# Patient Record
Sex: Female | Born: 1985 | Marital: Single
Health system: Southern US, Community
[De-identification: ages and names within clinical notes are randomized; demographics above are authoritative.]

---

## 2018-07-04 ENCOUNTER — Ambulatory Visit: Payer: 59 | Admitting: Licensed Clinical Social Worker

## 2018-07-04 ENCOUNTER — Ambulatory Visit (INDEPENDENT_AMBULATORY_CARE_PROVIDER_SITE_OTHER): Payer: 59 | Admitting: Licensed Clinical Social Worker

## 2018-07-04 DIAGNOSIS — F324 Major depressive disorder, single episode, in partial remission: Secondary | ICD-10-CM

## 2018-07-19 ENCOUNTER — Ambulatory Visit (INDEPENDENT_AMBULATORY_CARE_PROVIDER_SITE_OTHER): Payer: 59 | Admitting: Licensed Clinical Social Worker

## 2018-07-19 DIAGNOSIS — F324 Major depressive disorder, single episode, in partial remission: Secondary | ICD-10-CM | POA: Diagnosis not present

## 2018-07-25 ENCOUNTER — Ambulatory Visit: Payer: 59 | Admitting: Licensed Clinical Social Worker

## 2018-07-26 ENCOUNTER — Ambulatory Visit (INDEPENDENT_AMBULATORY_CARE_PROVIDER_SITE_OTHER): Payer: 59 | Admitting: Licensed Clinical Social Worker

## 2018-07-26 DIAGNOSIS — F324 Major depressive disorder, single episode, in partial remission: Secondary | ICD-10-CM

## 2018-08-02 ENCOUNTER — Ambulatory Visit (INDEPENDENT_AMBULATORY_CARE_PROVIDER_SITE_OTHER): Payer: 59 | Admitting: Licensed Clinical Social Worker

## 2018-08-02 DIAGNOSIS — F324 Major depressive disorder, single episode, in partial remission: Secondary | ICD-10-CM | POA: Diagnosis not present

## 2018-08-10 ENCOUNTER — Ambulatory Visit (INDEPENDENT_AMBULATORY_CARE_PROVIDER_SITE_OTHER): Payer: 59 | Admitting: Licensed Clinical Social Worker

## 2018-08-10 DIAGNOSIS — F324 Major depressive disorder, single episode, in partial remission: Secondary | ICD-10-CM | POA: Diagnosis not present

## 2018-08-23 ENCOUNTER — Ambulatory Visit (INDEPENDENT_AMBULATORY_CARE_PROVIDER_SITE_OTHER): Payer: 59 | Admitting: Licensed Clinical Social Worker

## 2018-08-23 DIAGNOSIS — F324 Major depressive disorder, single episode, in partial remission: Secondary | ICD-10-CM

## 2018-08-31 ENCOUNTER — Ambulatory Visit: Payer: 59 | Admitting: Licensed Clinical Social Worker

## 2018-09-21 ENCOUNTER — Ambulatory Visit: Payer: 59 | Admitting: Licensed Clinical Social Worker

## 2018-10-03 ENCOUNTER — Ambulatory Visit: Payer: 59 | Admitting: Licensed Clinical Social Worker

## 2018-10-19 ENCOUNTER — Ambulatory Visit (INDEPENDENT_AMBULATORY_CARE_PROVIDER_SITE_OTHER): Payer: 59 | Admitting: Licensed Clinical Social Worker

## 2018-10-19 DIAGNOSIS — F324 Major depressive disorder, single episode, in partial remission: Secondary | ICD-10-CM

## 2018-10-25 ENCOUNTER — Ambulatory Visit (INDEPENDENT_AMBULATORY_CARE_PROVIDER_SITE_OTHER): Payer: 59 | Admitting: Licensed Clinical Social Worker

## 2018-10-25 DIAGNOSIS — F331 Major depressive disorder, recurrent, moderate: Secondary | ICD-10-CM | POA: Diagnosis not present

## 2018-11-15 ENCOUNTER — Ambulatory Visit: Payer: 59 | Admitting: Licensed Clinical Social Worker

## 2018-12-14 ENCOUNTER — Ambulatory Visit (INDEPENDENT_AMBULATORY_CARE_PROVIDER_SITE_OTHER): Payer: BC Managed Care – PPO | Admitting: Licensed Clinical Social Worker

## 2018-12-14 DIAGNOSIS — F324 Major depressive disorder, single episode, in partial remission: Secondary | ICD-10-CM

## 2019-01-17 ENCOUNTER — Ambulatory Visit (INDEPENDENT_AMBULATORY_CARE_PROVIDER_SITE_OTHER): Payer: BC Managed Care – PPO | Admitting: Licensed Clinical Social Worker

## 2019-01-17 DIAGNOSIS — F324 Major depressive disorder, single episode, in partial remission: Secondary | ICD-10-CM

## 2019-07-26 ENCOUNTER — Other Ambulatory Visit: Payer: Self-pay | Admitting: Obstetrics and Gynecology

## 2019-07-26 DIAGNOSIS — M7989 Other specified soft tissue disorders: Secondary | ICD-10-CM

## 2019-08-09 ENCOUNTER — Other Ambulatory Visit: Payer: Self-pay

## 2019-08-16 ENCOUNTER — Ambulatory Visit
Admission: RE | Admit: 2019-08-16 | Discharge: 2019-08-16 | Disposition: A | Discharge status: Home / Self Care | Payer: BC Managed Care – PPO | Source: Ambulatory Visit | Attending: Obstetrics and Gynecology | Admitting: Obstetrics and Gynecology

## 2019-08-16 ENCOUNTER — Other Ambulatory Visit: Payer: Self-pay

## 2019-08-16 ENCOUNTER — Other Ambulatory Visit: Payer: Self-pay | Admitting: Obstetrics and Gynecology

## 2019-08-16 DIAGNOSIS — M7989 Other specified soft tissue disorders: Secondary | ICD-10-CM

## 2020-11-02 IMAGING — US US AXILLARY RIGHT
1 series · 5 of 5 positions shown · non-contrast
Comparison: None.

CLINICAL DATA: Fullness in both axilla. Palpable lump in the left
axillary tail.

EXAM:
DIGITAL DIAGNOSTIC BILATERAL MAMMOGRAM WITH CAD AND TOMO
ULTRASOUND BILATERAL BREAST

[Series 1: us axillary right · 0.06mm/px · 5 of 5 slices shown]
[im 1/5]
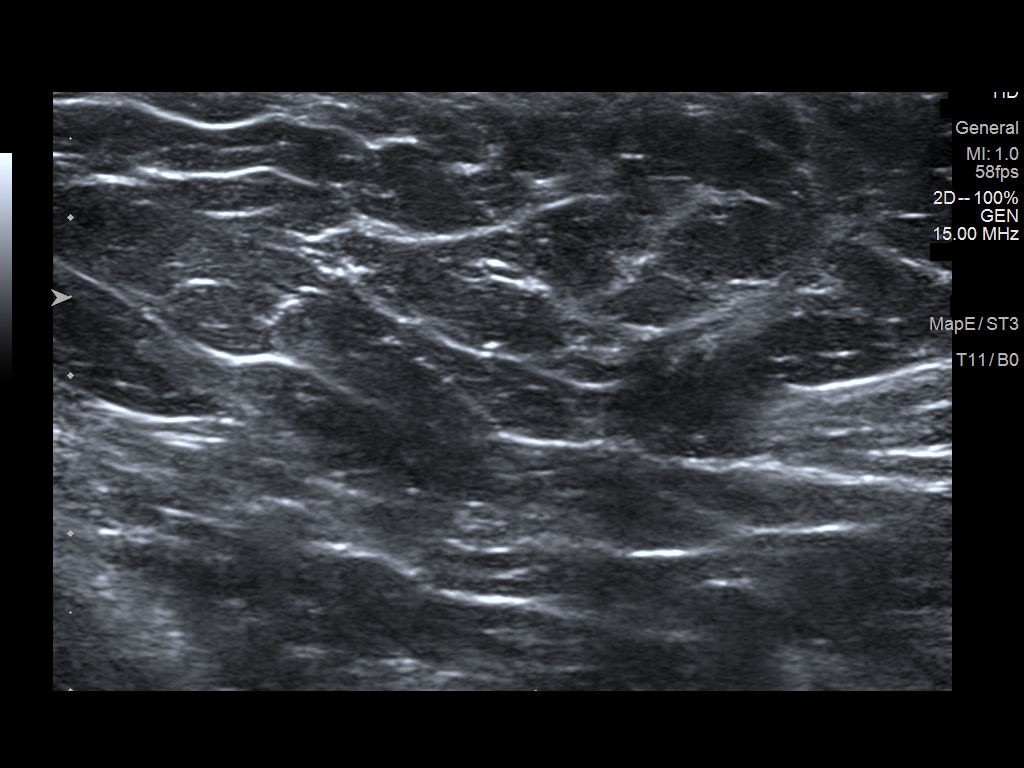
[im 2/5]
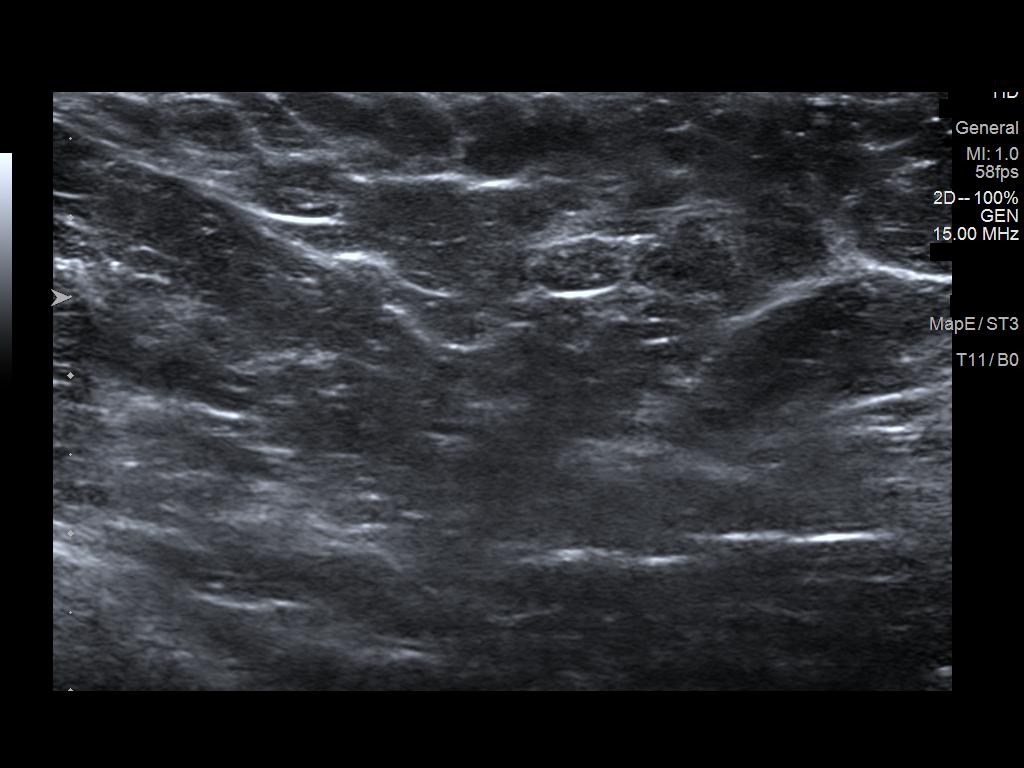
[im 3/5]
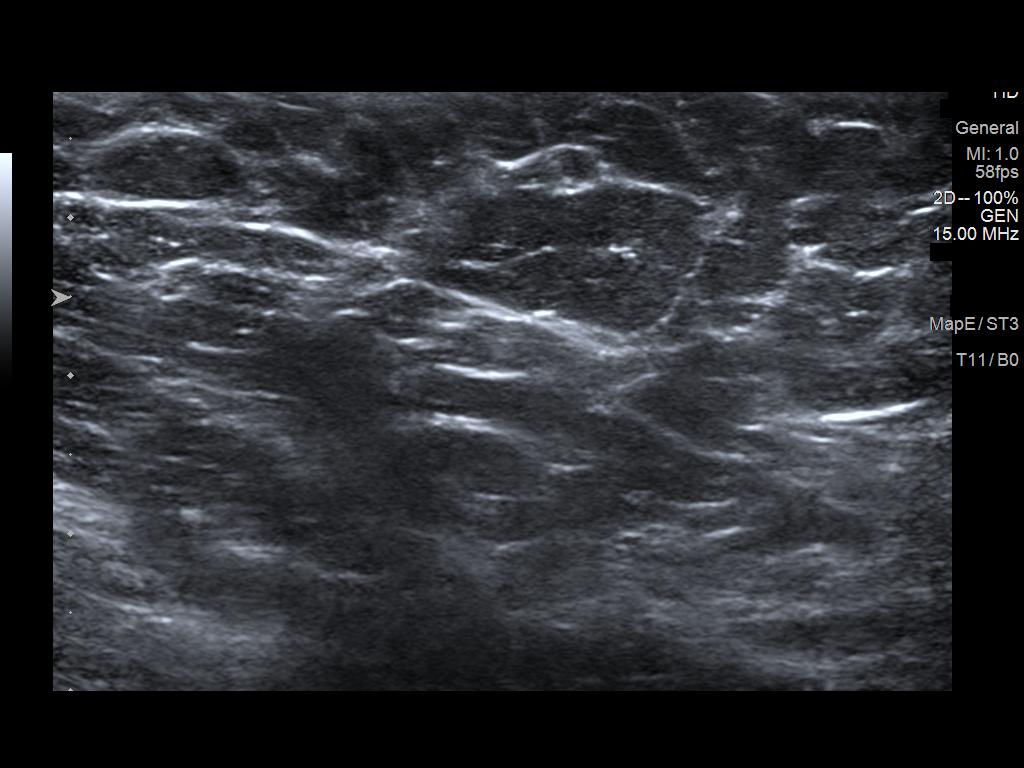
[im 4/5]
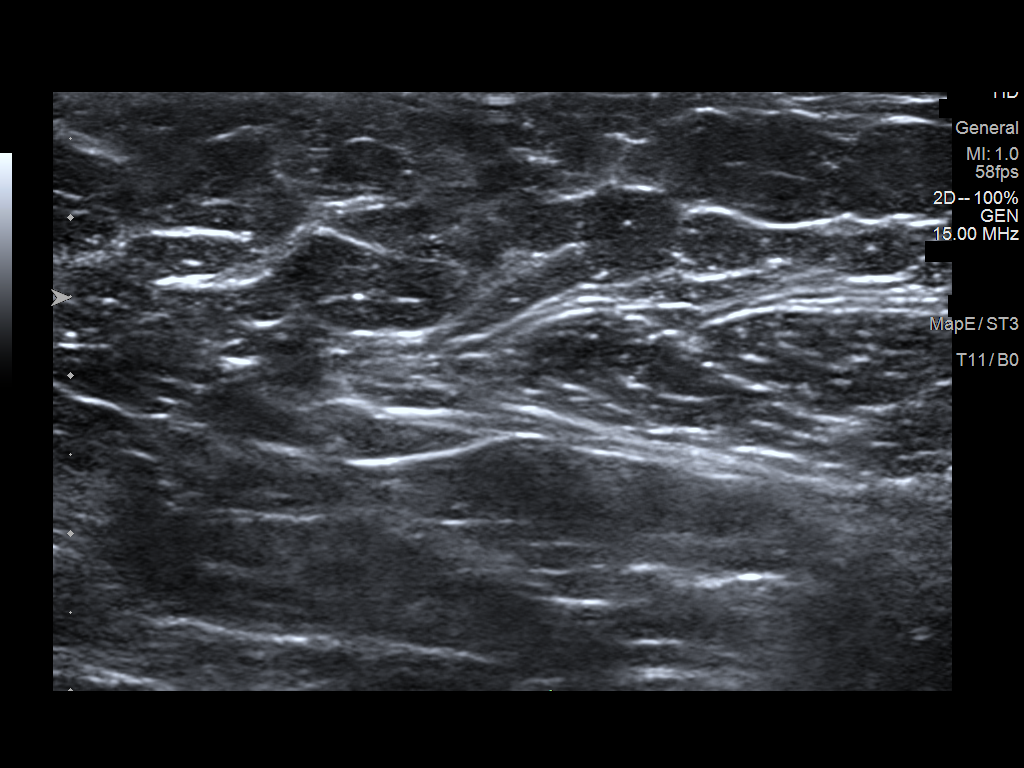
[im 5/5]
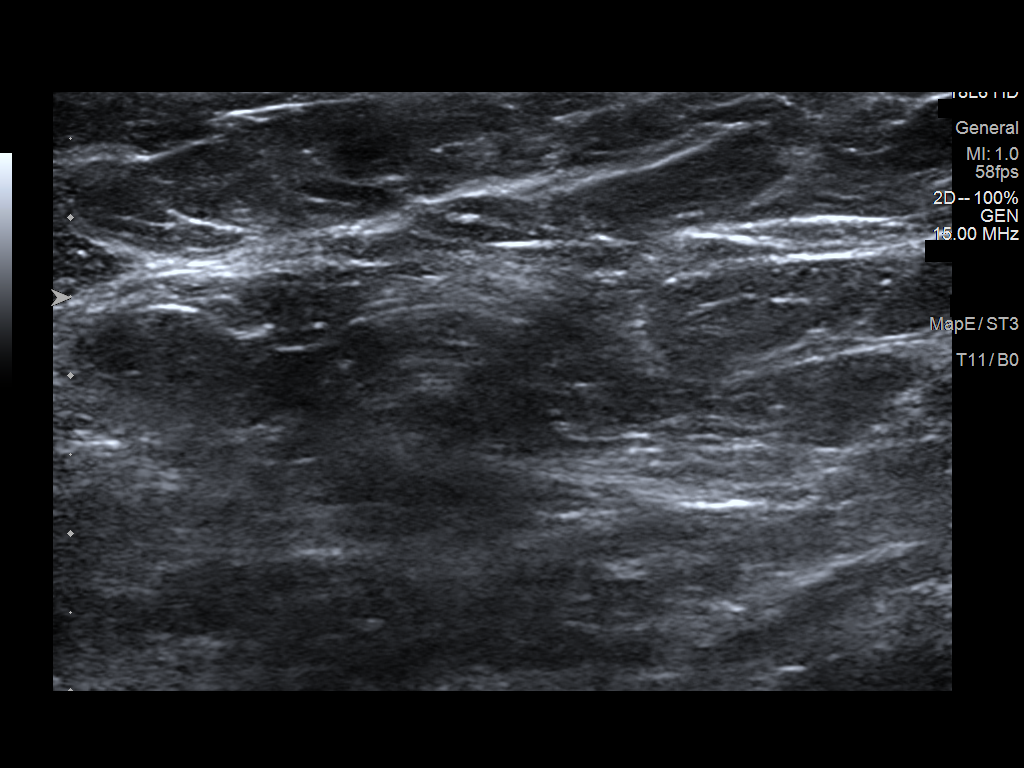

[5 of 5 positions shown; findings below may reference images not displayed]

ACR Breast Density Category b: There are scattered areas of
fibroglandular density.
FINDINGS: No suspicious masses, calcifications, or distortion are seen in the
right breast. There is a mass in the upper outer left breast,
probably an intramammary lymph node. No other suspicious findings on
the.

Mammographic images were processed with CAD.

On physical exam, no suspicious lumps are identified.

Targeted ultrasound is performed, showing normal tissue in the
axilla. An intramammary lymph node at 2 o'clock correlates with the
mammographically identified mass. No other suspicious findings.
IMPRESSION: No mammographic or sonographic evidence of malignancy.

RECOMMENDATION:
Annual screening mammography beginning at the age of 40.

I have discussed the findings and recommendations with the patient.
If applicable, a reminder letter will be sent to the patient
regarding the next appointment.

BI-RADS CATEGORY  2: Benign.

## 2020-11-02 IMAGING — US US BREAST*L* LIMITED INC AXILLA
1 series · 14 of 14 positions shown · non-contrast
Comparison: None.

CLINICAL DATA: Fullness in both axilla. Palpable lump in the left
axillary tail.

EXAM:
DIGITAL DIAGNOSTIC BILATERAL MAMMOGRAM WITH CAD AND TOMO
ULTRASOUND BILATERAL BREAST

[Series 1: us breast*left* limited inc axilla · 0.06mm/px · 14 of 14 slices shown]
[im 1/14]
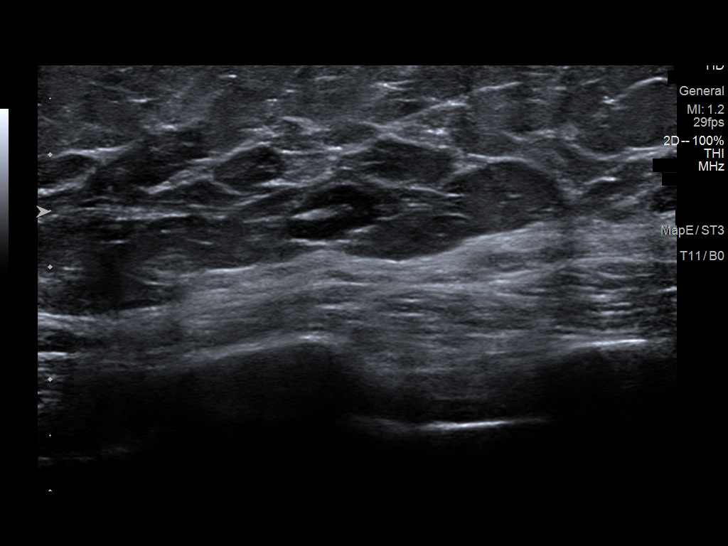
[im 2/14]
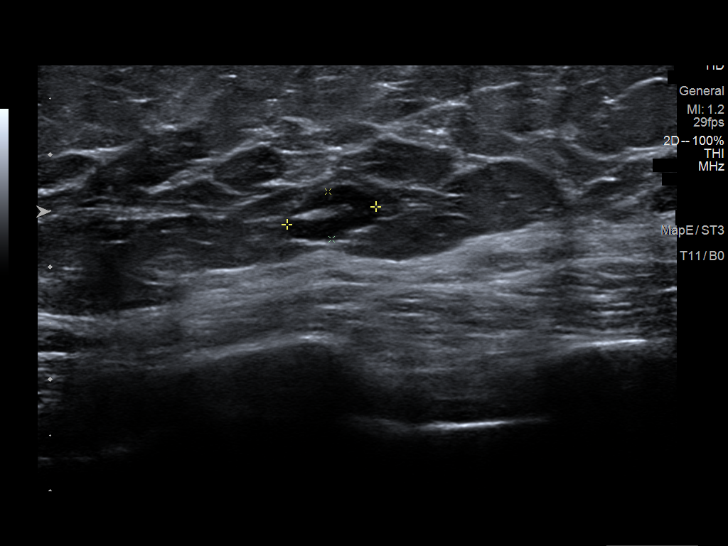
[im 3/14]
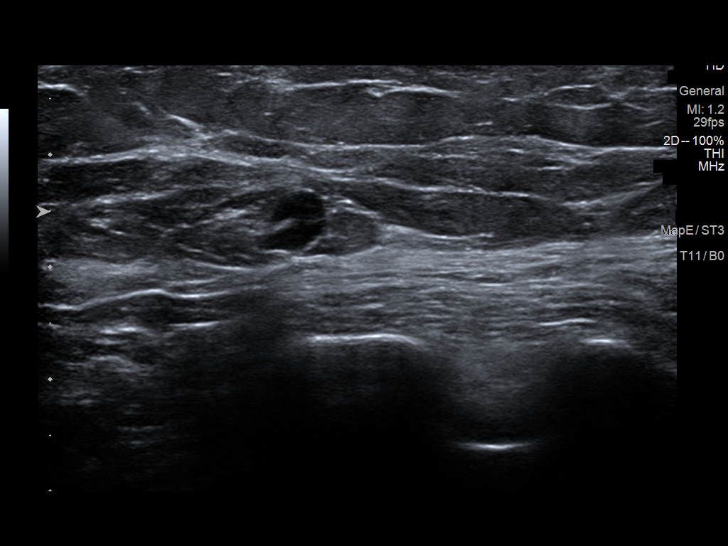
[im 4/14]
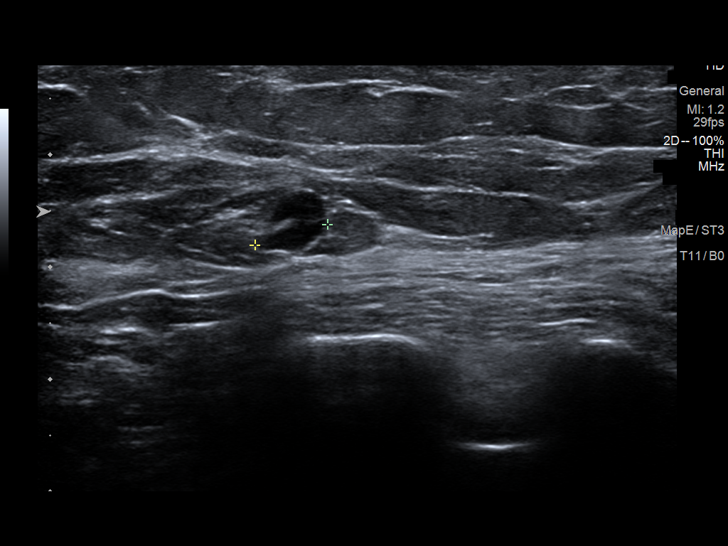
[im 5/14]
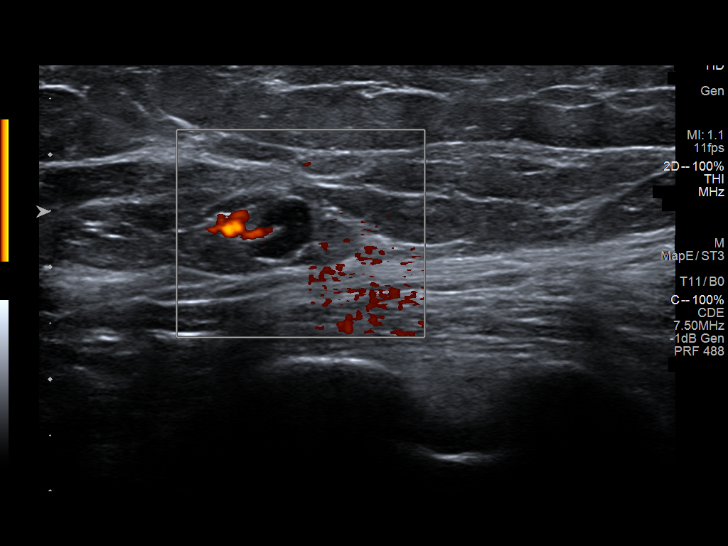
[im 6/14]
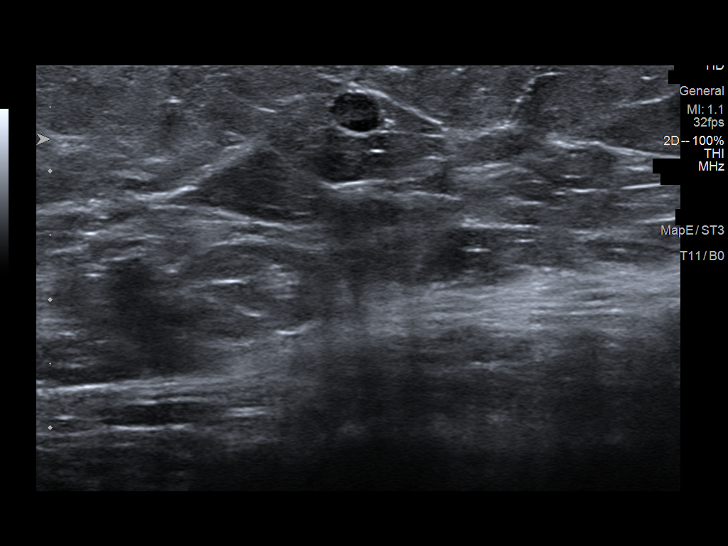
[im 7/14]
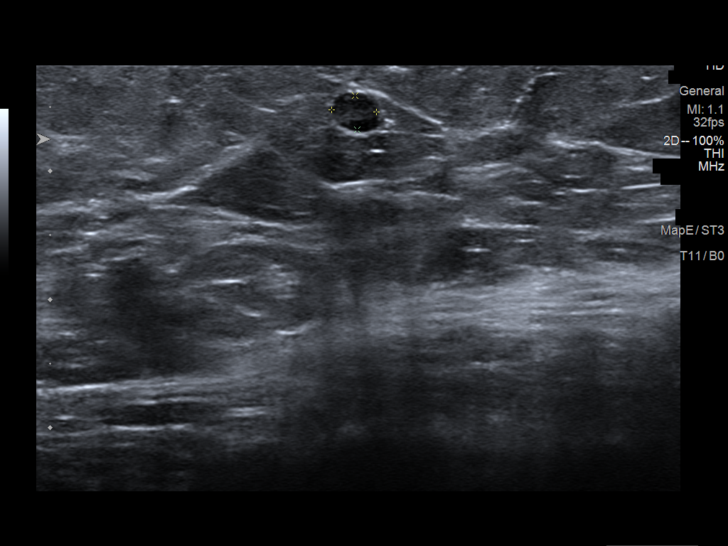
[im 8/14]
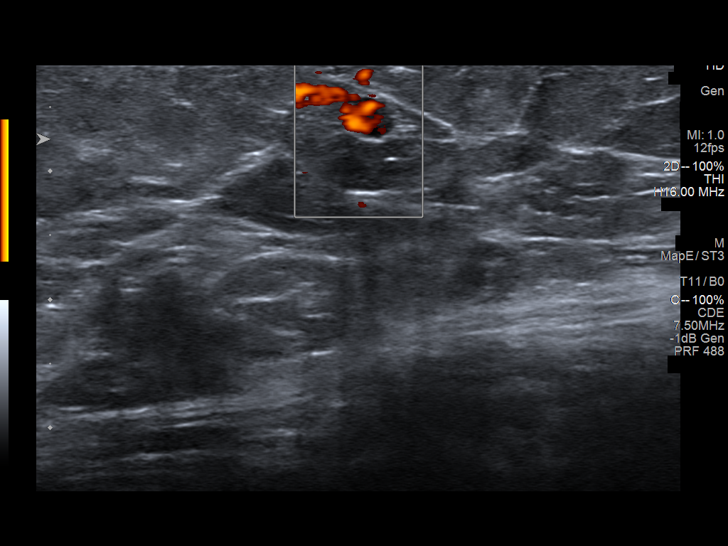
[im 9/14]
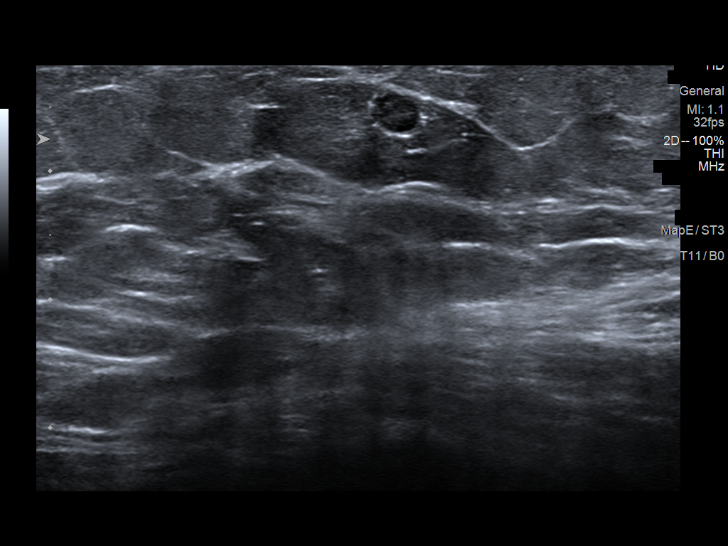
[im 10/14]
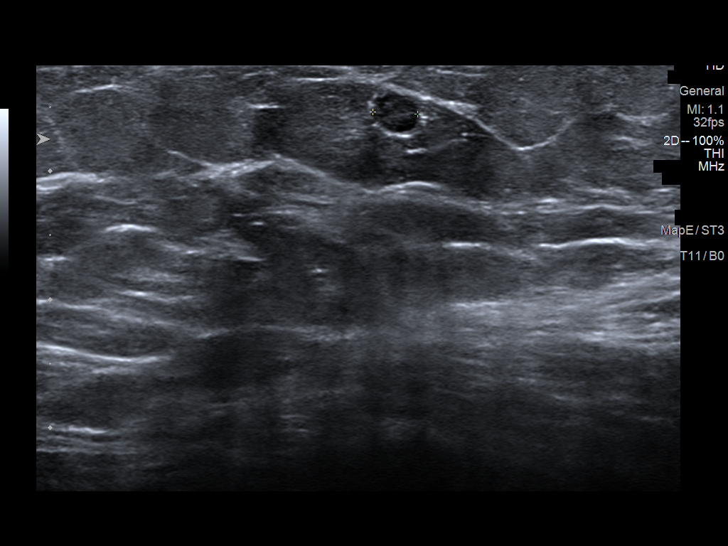
[im 11/14]
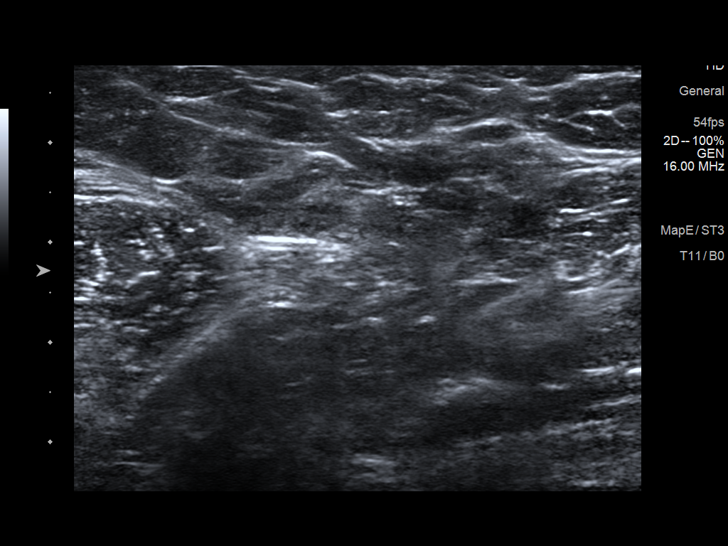
[im 12/14]
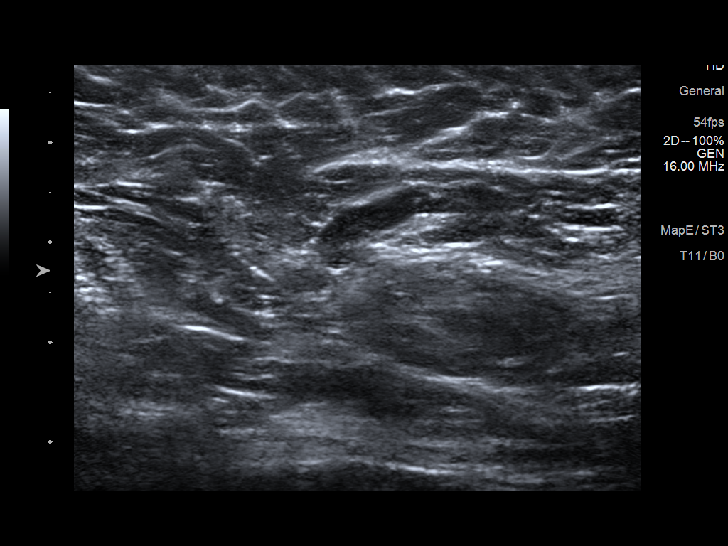
[im 13/14]
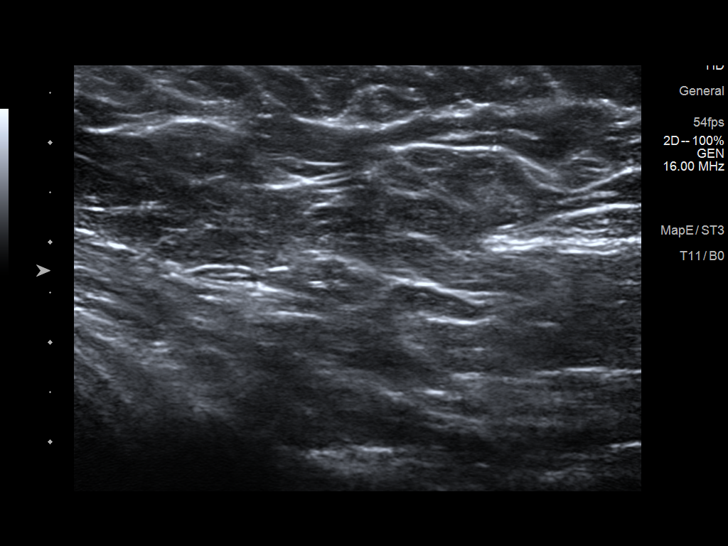
[im 14/14]
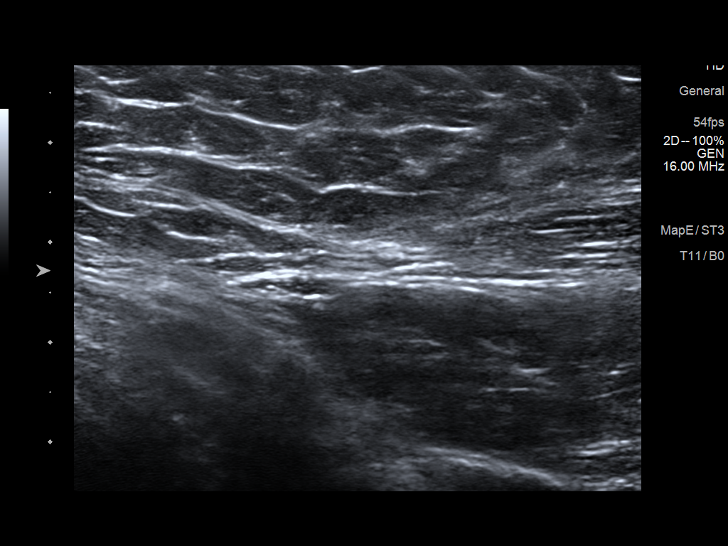

[14 of 14 positions shown; findings below may reference images not displayed]

ACR Breast Density Category b: There are scattered areas of
fibroglandular density.
FINDINGS: No suspicious masses, calcifications, or distortion are seen in the
right breast. There is a mass in the upper outer left breast,
probably an intramammary lymph node. No other suspicious findings on
the.

Mammographic images were processed with CAD.

On physical exam, no suspicious lumps are identified.

Targeted ultrasound is performed, showing normal tissue in the
axilla. An intramammary lymph node at 2 o'clock correlates with the
mammographically identified mass. No other suspicious findings.
IMPRESSION: No mammographic or sonographic evidence of malignancy.

RECOMMENDATION:
Annual screening mammography beginning at the age of 40.

I have discussed the findings and recommendations with the patient.
If applicable, a reminder letter will be sent to the patient
regarding the next appointment.

BI-RADS CATEGORY  2: Benign.

## 2020-11-02 IMAGING — MG DIGITAL DIAGNOSTIC BILAT W/ TOMO W/ CAD
6 of 12 series · 6 of 36 positions shown · non-contrast
Comparison: None.

CLINICAL DATA: Fullness in both axilla. Palpable lump in the left
axillary tail.

EXAM:
DIGITAL DIAGNOSTIC BILATERAL MAMMOGRAM WITH CAD AND TOMO
ULTRASOUND BILATERAL BREAST

[L MLO synth-2D]
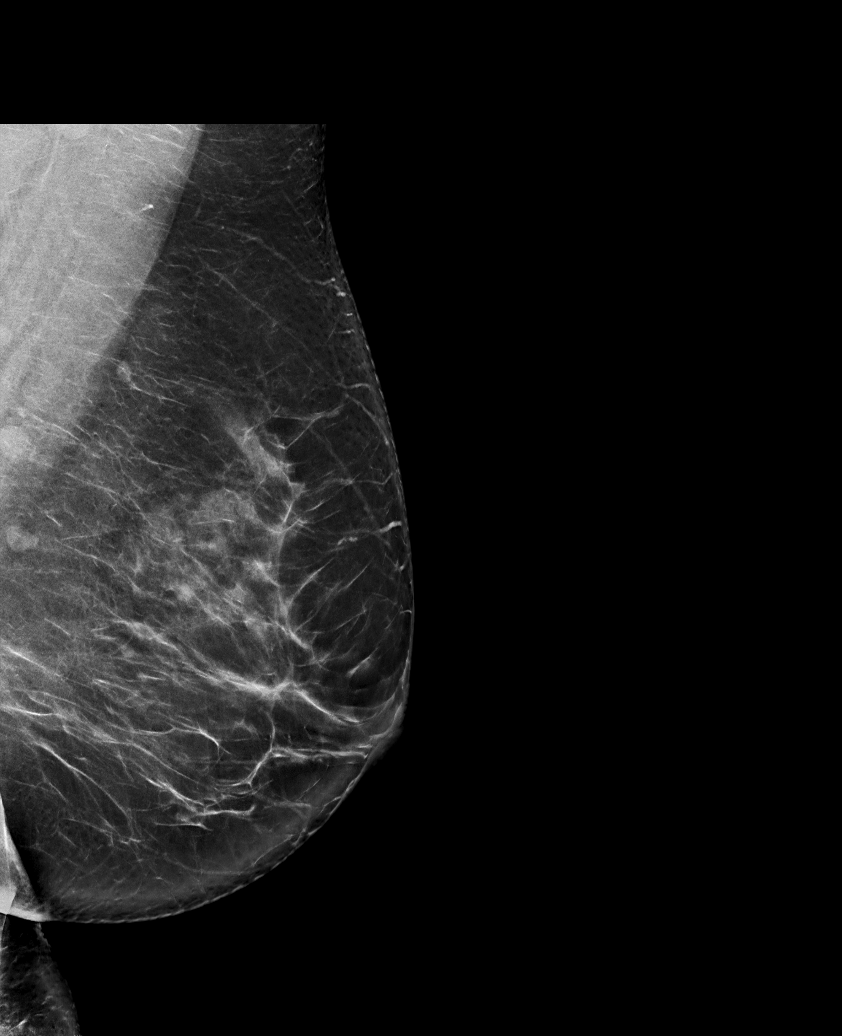

[R XCCL synth-2D]
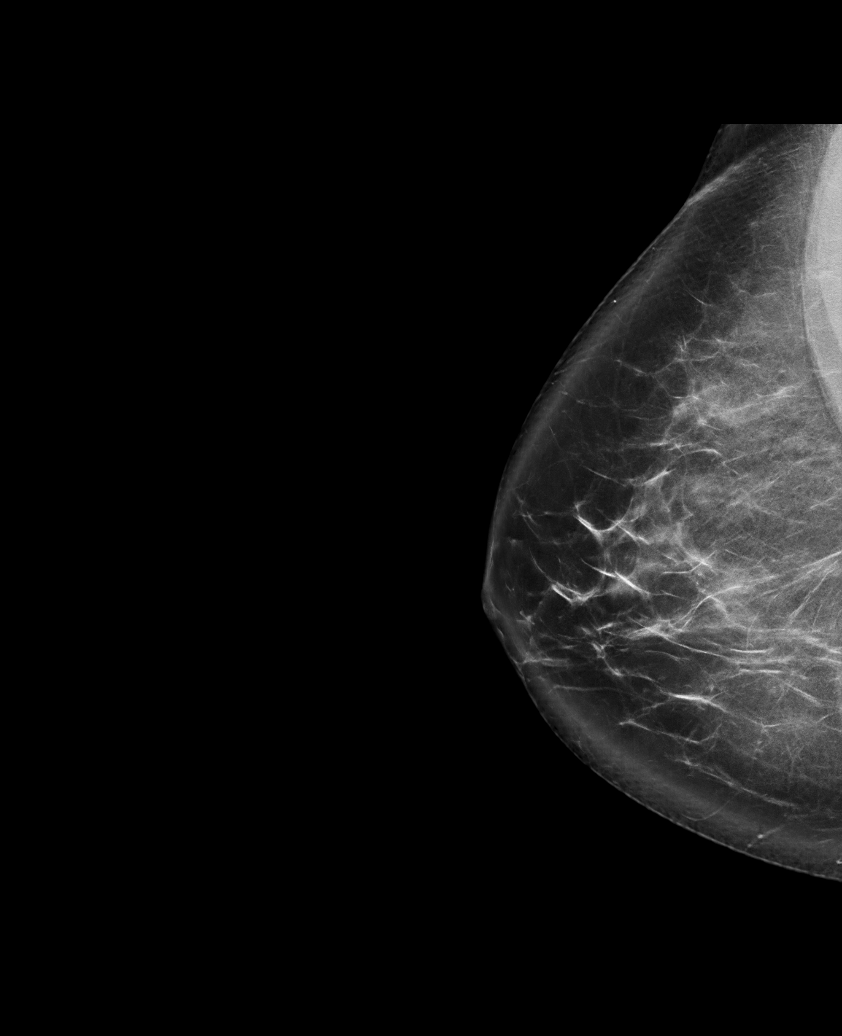

[L XCCL synth-2D]
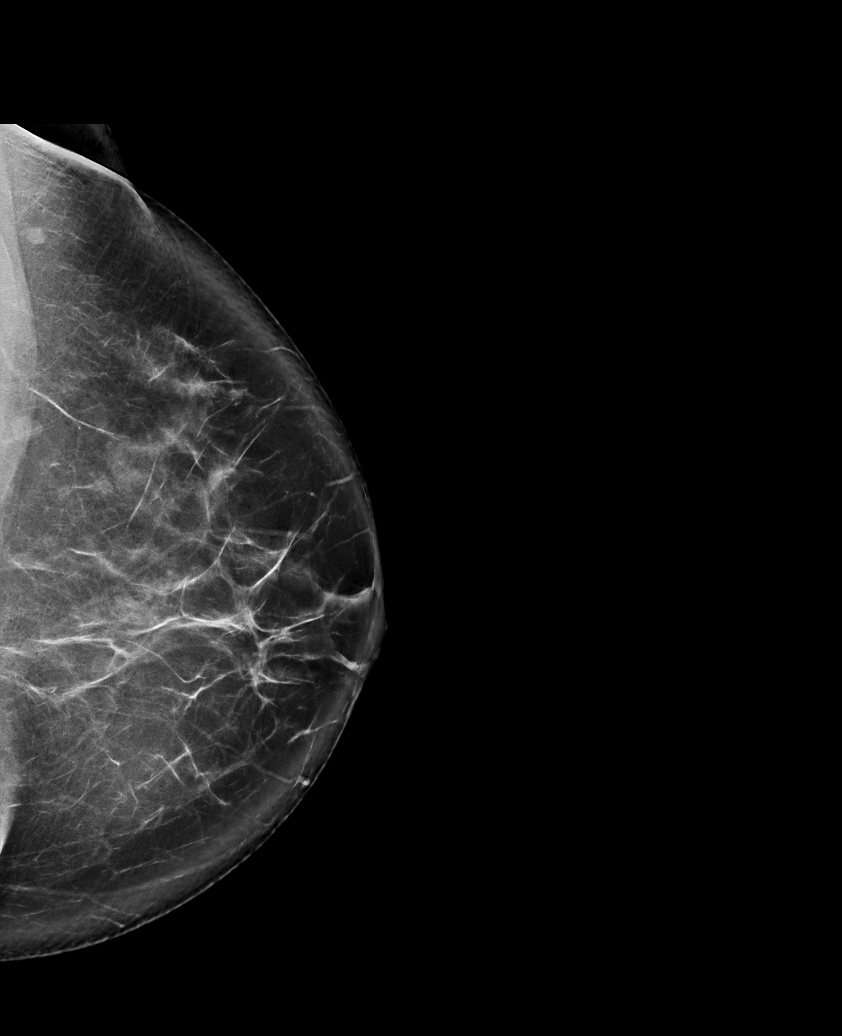

[R MLO synth-2D]
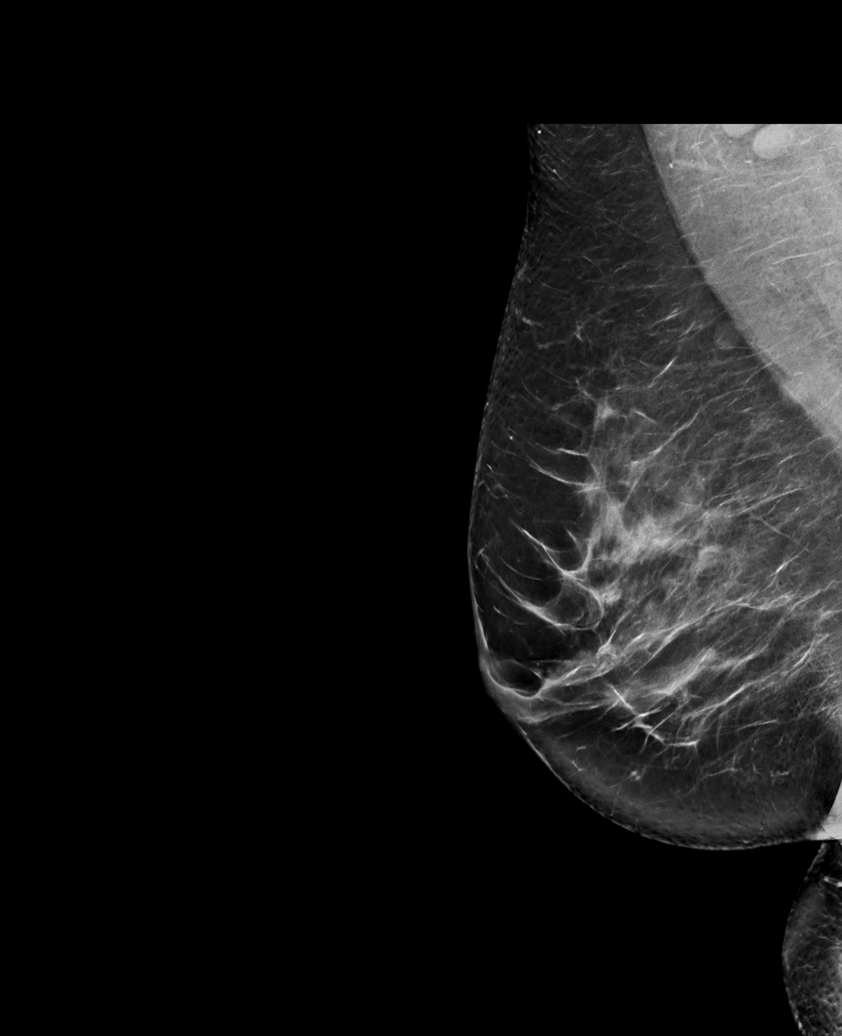

[L CC synth-2D]
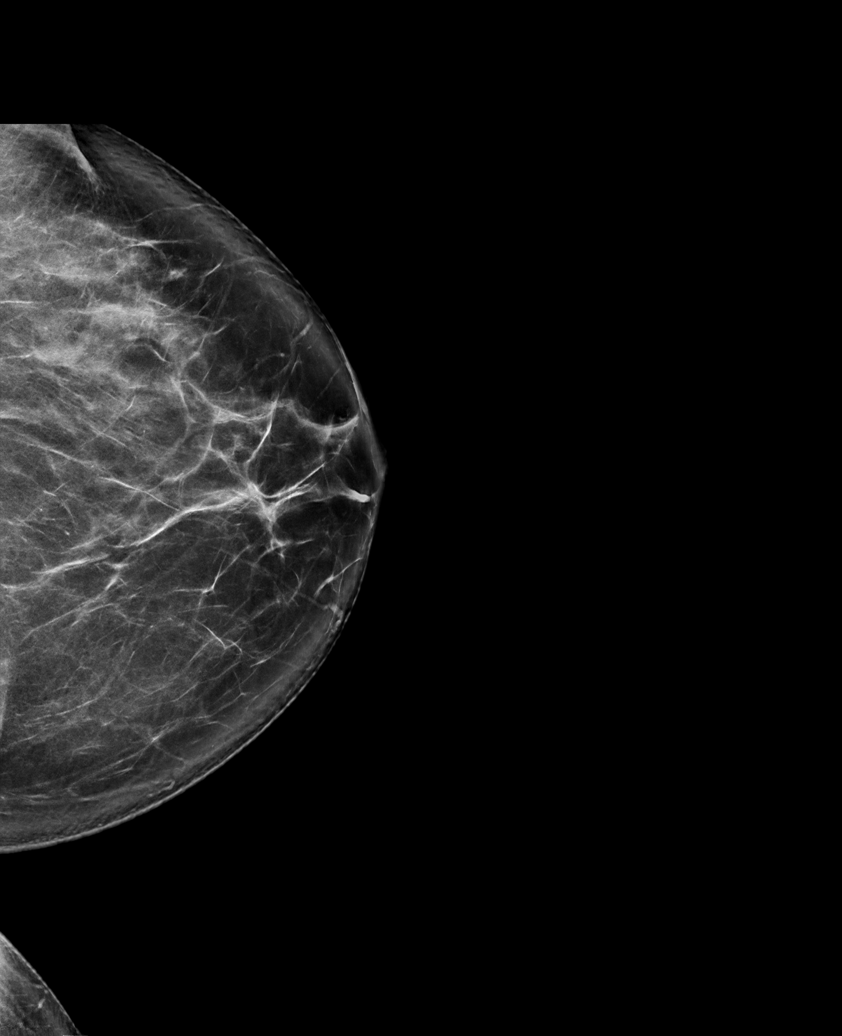

[R CC synth-2D]
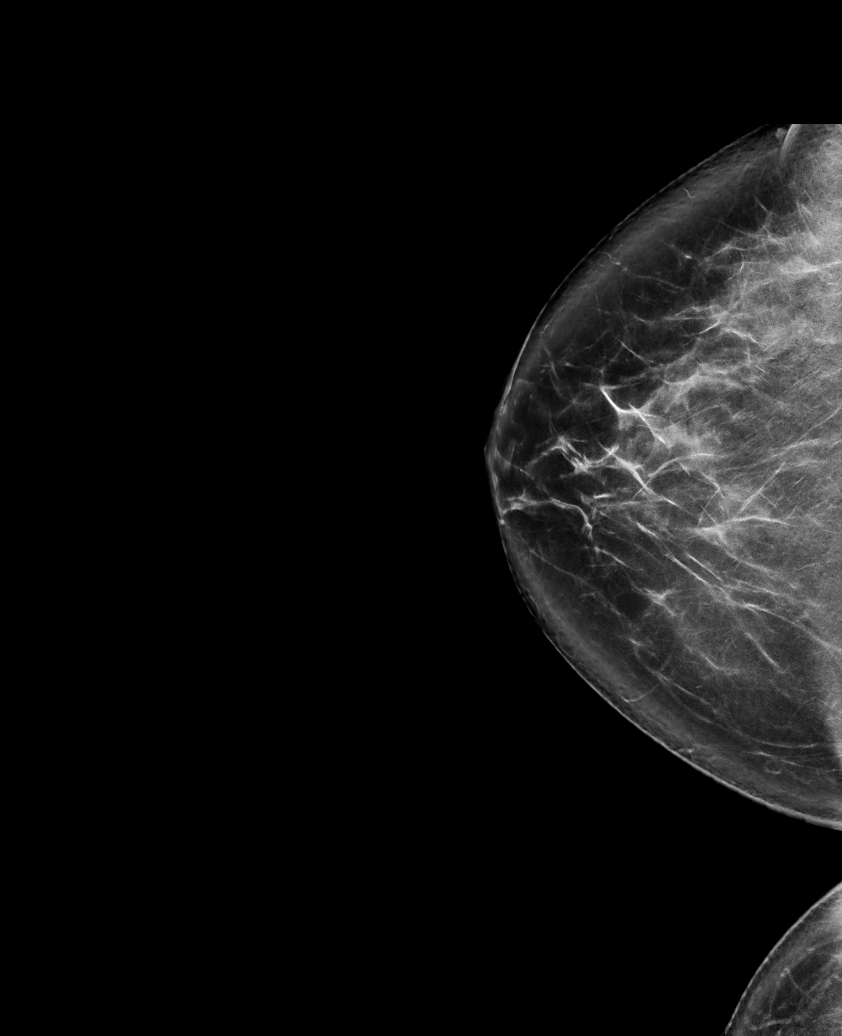

[6 of 36 positions shown; findings below may reference images not displayed]

ACR Breast Density Category b: There are scattered areas of
fibroglandular density.
FINDINGS: No suspicious masses, calcifications, or distortion are seen in the
right breast. There is a mass in the upper outer left breast,
probably an intramammary lymph node. No other suspicious findings on
the.

Mammographic images were processed with CAD.

On physical exam, no suspicious lumps are identified.

Targeted ultrasound is performed, showing normal tissue in the
axilla. An intramammary lymph node at 2 o'clock correlates with the
mammographically identified mass. No other suspicious findings.
IMPRESSION: No mammographic or sonographic evidence of malignancy.

RECOMMENDATION:
Annual screening mammography beginning at the age of 40.

I have discussed the findings and recommendations with the patient.
If applicable, a reminder letter will be sent to the patient
regarding the next appointment.

BI-RADS CATEGORY  2: Benign.

## 2021-01-28 ENCOUNTER — Ambulatory Visit (INDEPENDENT_AMBULATORY_CARE_PROVIDER_SITE_OTHER): Payer: BC Managed Care – PPO | Admitting: Psychology

## 2021-01-28 DIAGNOSIS — F331 Major depressive disorder, recurrent, moderate: Secondary | ICD-10-CM | POA: Diagnosis not present

## 2021-01-28 NOTE — Progress Notes (Signed)
[facility] Counselor Initial Adult Exam  Name: Betty Villanueva Date: 01/28/2021 MRN: [id number] DOB: 1985-11-07 PCP: No primary care provider on file.  Time spent: 4:00pm - 4:50pm   50 minutes  Guardian/Payee:  n/a    Paperwork requested: No   Reason for Visit /Presenting Problem:  Pt present for face-to-face initial assessment in person.  Pt has a history of anxiety and depression.  Pt has been in therapy in the past with Berniece AndreasJulie Whitt who retired last year.   The fall of 2022 pt started to have a difficult time.  The trigger for pt's increase in depression and anxiety was the stress of buying a townhouse with her boyfriend, getting a new dog and getting the dog acclimated with their older dog, moving,  stress at work.  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  She felt like cases were "dumped on her" at work and she did not like some of the ways she was treated.  Pt's 36 yo dog passed away in the fall.  Pt was having thoughts of suicide with no plan this past fall but does not currently have SI.   Pt has been open with her boyfriend and best friend about her mental health and past SI.   Pt states she struggles with self esteem at times.  She is working on loving herself for who she is.    Mental Status Exam: Appearance:   Well Groomed     Behavior:  Appropriate  Motor:  Normal  Speech/Language:   Normal Rate  Affect:  Appropriate  Mood:  normal  Thought process:  normal  Thought content:    WNL  Sensory/Perceptual disturbances:    WNL  Orientation:  oriented to person, place, time/date, and situation  Attention:  Good  Concentration:  Good  Memory:  WNL  Fund of knowledge:   Good  Insight:    Good  Judgment:   Good  Impulse Control:  Good   Reported Symptoms:  sadness, worrying, stress  Risk Assessment: Danger to Self:  No Self-injurious Behavior: No Danger to Others: No Duty to Warn:no Physical Aggression / Violence:No  Access to Firearms a concern:  No  Gang Involvement:No  Patient / guardian was educated about steps to take if suicide or homicide risk level increases between visits: n/a While future psychiatric events cannot be accurately predicted, the patient does not currently require acute inpatient psychiatric care and [facility] not [facility].  Substance Abuse History: Current substance abuse: No     Past Psychiatric History:   Previous psychological history is significant for anxiety and depression Outpatient Providers:Julie Whitt, LCSW History of Psych Hospitalization: No  Psychological Testing:  n/a    Abuse History:  Victim of: No.,  n/a    Report needed: No. Victim of Neglect:No. Perpetrator of  n/a   Witness / Exposure to Domestic Violence: No   Protective Services Involvement: No  Witness to [employer] Violence:  No   Family History:  Pt grew up with her mother when her mother was 36 years old.   Pt's mother was on drugs.  Pt does not know her biological father who has been in prison.   Pt married her step father when pt was really young.  The family [place] in [place]   Pt's mother and stepfather divorced when pt was in 6th grade.   Pt's SF was very strict and had a bad temper.  It was scary for pt when her parents got in fights.  Pt is an only child.   Pt lived with only her mother after the divorce.  She no longer has a relationship with her SF.   Family history of mental health issues:  mother has anxiety and depression.  Pt states her discipline as a child was extreme.  Pt was very afraid of her step father.   There is history of substance abuse in both mother and father.   Pt has experienced excessive drinking in the past.  She used alcohol to cope with stress.  Pt states it is still somewhat of an issue but has not had anything to drink in two weeks.    Living situation: the patient lives with their partner.  They have been together for 10 years,  Pt's  boyfriend is very supportive.    Sexual Orientation: Straight  Relationship Status: single  Name of spouse / other:n/a If a parent, number of children / ages:n/a  Support Systems: significant other, friends  Financial Stress:  No   Income/Employment/Disability: Employment.  Pt is an Pensions consultant.    Military Service: No   Educational History: Education: post Engineer, maintenance (IT) work or Engineer, technical sales: none  Any cultural differences that may affect / interfere with treatment:  not applicable   Recreation/Hobbies: Pt likes to do things with her dog.  Pt likes to cook and shop.  She likes to read.  She likes to be spontaneous.  She likes to spend time with her family and friends.  She likes working out.    Stressors: Occupational concerns   Other: work stress,   low self esteem    Strengths: Pt is bright, engaging, and motivated for therapy.  She is a good problem Psychologist, clinical.  She is a good Optometrist.    Barriers:  none noted.   Legal History: Pending legal issue / charges: The patient has no significant history of legal issues. History of legal issue / charges:  n/a  Medical History/Surgical History:  No past medical history on file.  No past surgical history on file.  Medications: Pt is prescribed sertraline 100mg .   No current outpatient medications on file.   No current facility-administered medications for this visit.    Not on File  Diagnoses:  F33.1  Plan of Care: Recommend ongoing therapy.  Pt participated in setting treatment goals.  Pt wants to have a safe place to talk so she doesn't bottle up her feelings..  Pt wants to improve coping skills.  Pt wants to learn tools to deal with difficult people and learn how to manage her anxiety and depression.  Pt wants to work on emotional regulation skills.   Plan to meet every two weeks.    Treatment Plan  (Target Date:  01/28/2022) Client Abilities/Strengths  Pt is bright,  engaging, and motivated for therapy.   Client Treatment Preferences  Individual therapy.  Client Statement of Needs  Improve coping skills. Have a safe place to talk.  Symptoms  Depressed or irritable mood. Diminished interest in or enjoyment of activities. Feelings of hopelessness, worthlessness, or inappropriate guilt. Low self-esteem.  Problems Addressed  Unipolar Depression Goals 1. Alleviate depressive symptoms and return to previous level of effective functioning. 2. Appropriately grieve the loss in order to normalize mood and to return to previously adaptive level of functioning. Objective Learn and implement behavioral strategies to overcome depression. Target Date: 2022-01-28 Frequency: Biweekly  Progress: 10 Modality:  individual  Related Interventions Engage the client in "behavioral activation," increasing his/her activity level and contact with sources of reward, while identifying processes that inhibit activation.  Use behavioral techniques such as instruction, rehearsal, role-playing, role reversal, as needed, to facilitate activity in the client's daily life; reinforce success. Assist the client in developing skills that increase the likelihood of deriving pleasure from behavioral activation (e.g., assertiveness skills, developing an exercise plan, less internal/more external focus, increased social involvement); reinforce success. Objective Identify important people in life, past and present, and describe the quality, good and poor, of those relationships. Target Date: 2022-01-28 Frequency: Biweekly  Progress: 10 Modality: individual  Related Interventions Conduct Interpersonal Therapy beginning with the assessment of the client's "interpersonal inventory" of important past and present relationships; develop a case formulation linking depression to grief, interpersonal role disputes, role transitions, and/or interpersonal deficits). Objective Learn and implement  problem-solving and decision-making skills. Target Date: 2022-01-28 Frequency: Biweekly  Progress: 10 Modality: individual  Related Interventions Conduct Problem-Solving Therapy using techniques such as psychoeducation, modeling, and role-playing to teach client problem-solving skills (i.e., defining a problem specifically, generating possible solutions, evaluating the pros and cons of each solution, selecting and implementing a plan of action, evaluating the efficacy of the plan, accepting or revising the plan); role-play application of the problem-solving skill to a real life issue. Encourage in the client the development of a positive problem orientation in which problems and solving them are viewed as a natural part of life and not something to be feared, despaired, or avoided. 3. Develop healthy interpersonal relationships that lead to the alleviation and help prevent the relapse of depression. 4. Develop healthy thinking patterns and beliefs about self, others, and the world that lead to the alleviation and help prevent the relapse of depression. 5. Recognize, accept, and cope with feelings of depression. Diagnosis F33.1  Conditions For Discharge Achievement of treatment goals and objectives    Salomon Fick, LCSW

## 2021-01-28 NOTE — Progress Notes (Signed)
° ° ° ° ° ° ° ° ° ° ° ° ° ° °  Mahika Vanvoorhis, LCSW °

## 2021-02-11 ENCOUNTER — Ambulatory Visit (INDEPENDENT_AMBULATORY_CARE_PROVIDER_SITE_OTHER): Payer: BC Managed Care – PPO | Admitting: Psychology

## 2021-02-11 DIAGNOSIS — F331 Major depressive disorder, recurrent, moderate: Secondary | ICD-10-CM

## 2021-02-11 NOTE — Progress Notes (Signed)
[facility] Counselor/Therapist Progress Note  Patient ID: Betty Villanueva, MRN: [id number],    Date: 02/11/2021  Time Spent: 5:00pm-6:00pm   60 minutes   Treatment Type: Individual Therapy  Reported Symptoms: stress  Mental Status Exam: Appearance:  Well Groomed     Behavior: Appropriate  Motor: Normal  Speech/Language:  Normal Rate  Affect: Appropriate  Mood: normal  Thought process: normal  Thought content:   WNL  Sensory/Perceptual disturbances:   WNL  Orientation: oriented to person, place, time/date, and situation  Attention: Good  Concentration: Good  Memory: WNL  Fund of knowledge:  Good  Insight:   Good  Judgment:  Good  Impulse Control: Good   Risk Assessment: Danger to Self:  No Self-injurious Behavior: No Danger to Others: No Duty to Warn:no Physical Aggression / Violence:No  Access to Firearms a concern: No  Gang Involvement:No   Subjective:  Pt present for face-to-face individual therapy in person.  Pt states she has been doing well.  She is feeling better physically and is not sick.   She is working on present moment focusing.  Pt has been using the note feature on her phone to stay organized.  Pt has increased self care which has helped her feel better.  She is eating more healthily and exercising.   She is delegating and letting go of things she should have a long time ago at work.  Pt has decreased her drinking so that she is not drinking to cope.    Pt is worried that this "good period" may not last.   Addressed the things pt has put into place that have helped her.  Acknowledged her for the action steps she has taken.   Recommended that pt write down the things that contributed to her improved mood so she can reference them if she has difficult times.  Identified the changes pt has made in her self talk.  She is being kinder and more compassionate with herself.   Worked on additional thought reframing and affirming self talk pt can utilize.    Encouraged pt to continue to increase self care. Provided supportive therapy.      Interventions: Cognitive Behavioral Therapy and Insight-Oriented  Diagnosis: F33.1  Plan: Recommend ongoing therapy.  Pt participated in setting treatment goals.  Pt wants to have a safe place to talk so she doesn't bottle up her feelings..  Pt wants to improve coping skills.  Pt wants to learn tools to deal with difficult people and learn how to manage her anxiety and depression.  Pt wants to work on emotional regulation skills.   Plan to meet every two weeks.    Treatment Plan  (Target Date:  01/28/2022) Client Abilities/Strengths  Pt is bright, engaging, and motivated for therapy.   Client Treatment Preferences  Individual therapy.  Client Statement of Needs  Improve coping skills. Have a safe place to talk.  Symptoms  Depressed or irritable mood. Diminished interest in or enjoyment of activities. Feelings of hopelessness, worthlessness, or inappropriate guilt. Low self-esteem.  Problems Addressed  Unipolar Depression Goals 1. Alleviate depressive symptoms and return to previous level of effective functioning. 2. Appropriately grieve the loss in order to normalize mood and to return to previously adaptive level of functioning. Objective Learn and implement behavioral strategies to overcome depression. Target Date: 2022-01-28 Frequency: Biweekly  Progress: 10 Modality: individual  Related Interventions Engage the client in "behavioral activation," increasing his/her activity level and contact with sources of reward, while identifying processes  that inhibit activation.  Use behavioral techniques such as instruction, rehearsal, role-playing, role reversal, as needed, to facilitate activity in the client's daily life; reinforce success. Assist the client in developing skills that increase the likelihood of deriving pleasure from behavioral activation (e.g., assertiveness skills, developing an  exercise plan, less internal/more external focus, increased social involvement); reinforce success. Objective Identify important people in life, past and present, and describe the quality, good and poor, of those relationships. Target Date: 2022-01-28 Frequency: Biweekly  Progress: 10 Modality: individual  Related Interventions Conduct Interpersonal Therapy beginning with the assessment of the client's "interpersonal inventory" of important past and present relationships; develop a case formulation linking depression to grief, interpersonal role disputes, role transitions, and/or interpersonal deficits). Objective Learn and implement problem-solving and decision-making skills. Target Date: 2022-01-28 Frequency: Biweekly  Progress: 10 Modality: individual  Related Interventions Conduct Problem-Solving Therapy using techniques such as psychoeducation, modeling, and role-playing to teach client problem-solving skills (i.e., defining a problem specifically, generating possible solutions, evaluating the pros and cons of each solution, selecting and implementing a plan of action, evaluating the efficacy of the plan, accepting or revising the plan); role-play application of the problem-solving skill to a real life issue. Encourage in the client the development of a positive problem orientation in which problems and solving them are viewed as a natural part of life and not something to be feared, despaired, or avoided. 3. Develop healthy interpersonal relationships that lead to the alleviation and help prevent the relapse of depression. 4. Develop healthy thinking patterns and beliefs about self, others, and the world that lead to the alleviation and help prevent the relapse of depression. 5. Recognize, accept, and cope with feelings of depression. Diagnosis F33.1  Conditions For Discharge Achievement of treatment goals and objectives    Salomon Fick, LCSW

## 2021-03-18 ENCOUNTER — Ambulatory Visit (INDEPENDENT_AMBULATORY_CARE_PROVIDER_SITE_OTHER): Payer: BC Managed Care – PPO | Admitting: Psychology

## 2021-03-18 DIAGNOSIS — F331 Major depressive disorder, recurrent, moderate: Secondary | ICD-10-CM

## 2021-03-18 NOTE — Progress Notes (Signed)
[facility] Counselor/Therapist Progress Note ? ?Patient ID: Betty Villanueva, MRN: [id number],   ? ?Date: 03/18/2021 ? ?Time Spent: 4:00pm-5:00pm   60 minutes  ? ?Treatment Type: Individual Therapy ? ?Reported Symptoms: stress ? ?Mental Status Exam: ?Appearance:  Well Groomed     ?Behavior: Appropriate  ?Motor: Normal  ?Speech/Language:  Normal Rate  ?Affect: Appropriate  ?Mood: normal  ?Thought process: normal  ?Thought content:   WNL  ?Sensory/Perceptual disturbances:   WNL  ?Orientation: oriented to person, place, time/date, and situation  ?Attention: Good  ?Concentration: Good  ?Memory: WNL  ?Fund of knowledge:  Good  ?Insight:   Good  ?Judgment:  Good  ?Impulse Control: Good  ? ?Risk Assessment: ?Danger to Self:  No ?Self-injurious Behavior: No ?Danger to Others: No ?Duty to Warn:no ?Physical Aggression / Violence:No  ?Access to Firearms a concern: No  ?Gang Involvement:No  ? ?Subjective:  Pt present for face-to-face individual therapy in person.  ?Pt talked about having a meltdown a few weeks ago about work.  She was in a tail spin of negativity for a few days.  She was more aware of what she was experiencing.   She had challenges with an attorney colleague.  Addressed the challenges.  Pt felt anxiety and stress when dealing with the other attorney.  Helped pt process her feelings and the work dynamics.   Addressed how pt can set healthy boundaries with the difficult people at work.   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   It really bothers pt that they treat her badly.  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  Addressed how pt can depersonalize those interactions.   ?Worked on additional thought reframing and affirming self talk pt can utilize.   ?Encouraged pt to continue to increase self care. ?Provided supportive therapy.   ?  ? ?Interventions: Cognitive Behavioral Therapy and Insight-Oriented ? ?Diagnosis: F33.1 ? ?Plan: Recommend ongoing therapy.  Pt  participated in setting treatment goals.  Pt wants to have a safe place to talk so she doesn't bottle up her feelings..  Pt wants to improve coping skills.  Pt wants to learn tools to deal with difficult people and learn how to manage her anxiety and depression.  Pt wants to work on emotional regulation skills.   Plan to meet every two weeks.   ? ?Treatment Plan  (Target Date:  01/28/2022) ?Client Abilities/Strengths  ?Pt is bright, engaging, and motivated for therapy.   ?Client Treatment Preferences  ?Individual therapy.  ?Client Statement of Needs  ?Improve coping skills. Have a safe place to talk.  ?Symptoms  ?Depressed or irritable mood. ?Diminished interest in or enjoyment of activities. ?Feelings of hopelessness, worthlessness, or inappropriate guilt. ?Low self-esteem. ? ?Problems Addressed  ?Unipolar Depression ?Goals ?1. Alleviate depressive symptoms and return to previous level of effective functioning. ?2. Appropriately grieve the loss in order to normalize mood and to return to previously adaptive level of functioning. ?Objective ?Learn and implement behavioral strategies to overcome depression. ?Target Date: 2022-01-28 Frequency: Biweekly  ?Progress: 10 Modality: individual  ?Related Interventions ?Engage the client in "behavioral activation," increasing his/her activity level and contact with sources of reward, while identifying processes that inhibit activation.  Use behavioral techniques such as instruction, rehearsal, role-playing, role reversal, as needed, to facilitate activity in the client's daily life; reinforce success. ?Assist the client in developing skills that increase the likelihood of deriving pleasure from behavioral activation (e.g., assertiveness skills, developing an exercise plan, less  internal/more external focus, increased social involvement); reinforce success. ?Objective ?Identify important people in life, past and present, and describe the quality, good and poor, of those  relationships. ?Target Date: 2022-01-28 Frequency: Biweekly  ?Progress: 10 Modality: individual  ?Related Interventions ?Conduct Interpersonal Therapy beginning with the assessment of the client's "interpersonal inventory" of important past and present relationships; develop a case formulation linking depression to grief, interpersonal role disputes, role transitions, and/or interpersonal deficits). ?Objective ?Learn and implement problem-solving and decision-making skills. ?Target Date: 2022-01-28 Frequency: Biweekly  ?Progress: 10 Modality: individual  ?Related Interventions ?Conduct Problem-Solving Therapy using techniques such as psychoeducation, modeling, and role-playing to teach client problem-solving skills (i.e., defining a problem specifically, generating possible solutions, evaluating the pros and cons of each solution, selecting and implementing a plan of action, evaluating the efficacy of the plan, accepting or revising the plan); role-play application of the problem-solving skill to a real life issue. ?Encourage in the client the development of a positive problem orientation in which problems and solving them are viewed as a natural part of life and not something to be feared, despaired, or avoided. ?3. Develop healthy interpersonal relationships that lead to the alleviation and help prevent the relapse of depression. ?4. Develop healthy thinking patterns and beliefs about self, others, and the world that lead to the alleviation and help prevent the relapse of depression. ?5. Recognize, accept, and cope with feelings of depression. ?Diagnosis ?F33.1  ?Conditions For Discharge ?Achievement of treatment goals and objectives  ? ? ?Gay Rape, LCSW ? ? ? ?

## 2021-04-02 ENCOUNTER — Ambulatory Visit: Payer: BC Managed Care – PPO | Admitting: Psychology

## 2021-04-16 ENCOUNTER — Ambulatory Visit: Payer: BC Managed Care – PPO | Admitting: Psychology

## 2021-05-28 ENCOUNTER — Ambulatory Visit: Payer: BC Managed Care – PPO | Admitting: Psychology

## 2021-06-17 ENCOUNTER — Ambulatory Visit: Payer: BC Managed Care – PPO | Admitting: Psychology

## 2021-07-22 ENCOUNTER — Ambulatory Visit: Payer: BC Managed Care – PPO | Admitting: Psychology

## 2021-09-02 ENCOUNTER — Ambulatory Visit: Payer: BC Managed Care – PPO | Admitting: Psychology
